# Patient Record
Sex: Female | Born: 1965 | Hispanic: No | Marital: Single | Smoking: Current every day smoker
Health system: Southern US, Community
[De-identification: ages and names within clinical notes are randomized; demographics above are authoritative.]

---

## 2017-08-13 ENCOUNTER — Other Ambulatory Visit: Payer: Self-pay

## 2017-08-13 ENCOUNTER — Emergency Department
Admission: EM | Admit: 2017-08-13 | Discharge: 2017-08-13 | Disposition: A | Discharge status: Home / Self Care | Payer: Self-pay | Attending: Emergency Medicine | Admitting: Emergency Medicine

## 2017-08-13 ENCOUNTER — Emergency Department: Payer: Self-pay

## 2017-08-13 DIAGNOSIS — F17200 Nicotine dependence, unspecified, uncomplicated: Secondary | ICD-10-CM | POA: Insufficient documentation

## 2017-08-13 DIAGNOSIS — Y929 Unspecified place or not applicable: Secondary | ICD-10-CM | POA: Insufficient documentation

## 2017-08-13 DIAGNOSIS — M25561 Pain in right knee: Secondary | ICD-10-CM

## 2017-08-13 DIAGNOSIS — M1711 Unilateral primary osteoarthritis, right knee: Secondary | ICD-10-CM | POA: Insufficient documentation

## 2017-08-13 DIAGNOSIS — X58XXXA Exposure to other specified factors, initial encounter: Secondary | ICD-10-CM | POA: Insufficient documentation

## 2017-08-13 DIAGNOSIS — Y939 Activity, unspecified: Secondary | ICD-10-CM | POA: Insufficient documentation

## 2017-08-13 DIAGNOSIS — Y999 Unspecified external cause status: Secondary | ICD-10-CM | POA: Insufficient documentation

## 2017-08-13 MED ORDER — MELOXICAM 15 MG PO TABS: 15.0000 mg | ORAL_TABLET | Freq: Every day | ORAL | 2 refills | Status: AC

## 2017-08-13 MED ORDER — IBUPROFEN 600 MG PO TABS
600.0000 mg | ORAL_TABLET | Freq: Once | ORAL | Status: AC
  Administered 2017-08-13: 600 mg via ORAL
  Filled 2017-08-13: qty 1

## 2017-08-13 NOTE — ED Triage Notes (Signed)
Right knee pain X 3 days. No known injury. States she feels like something is moving in her right knee cap. Swelling to knee and down to right ankle present. No obvious redness. Pain worse with ambulation

## 2017-08-13 NOTE — ED Notes (Signed)
Pt ambulatory to desk without difficulty. States that her knee is swollen. She is able to bear weight.

## 2017-08-13 NOTE — ED Provider Notes (Signed)
[facility] Emergency Department Provider Note  ____________________________________________   First MD Initiated Contact with Patient 08/13/17 [phone]     (approximate)  I have reviewed the triage vital signs and the nursing notes.   HISTORY  Chief Complaint Knee Pain  HPI Paula Fleming is a 52 y.o. female is here complaint of right knee pain for 3 days.  Patient states that she had an injury to her knee years ago where she was told she had a chip fracture.  She has been using topical creams to her knee and using ice and heat without any relief of her pain.  She has not taken any over-the-counter medication.  No recent injury.  Currently she rates her pain as 7 out of 10.  Patient continues to ambulate without assistance.  History reviewed. No pertinent past medical history.  There are no active problems to display for this patient.   History reviewed. No pertinent surgical history.  Prior to Admission medications   Medication Sig Start Date End Date Taking? Authorizing Provider  meloxicam (MOBIC) 15 MG tablet Take 1 tablet (15 mg total) by mouth daily. 08/13/17 08/13/18  Tommi RumpsSummers, Rhonda L, PA-C    Allergies Patient has no known allergies.  No family history on file.  Social History Social History   Tobacco Use  . Smoking status: Current Every Day Smoker  Substance Use Topics  . Alcohol use: Not Currently  . Drug use: Not on file    Review of Systems Constitutional: No fever/chills Cardiovascular: Denies chest pain. Respiratory: Denies shortness of breath. Gastrointestinal: No abdominal pain.  No nausea, no vomiting.  Genitourinary: Negative for dysuria. Musculoskeletal: Positive for right knee pain. Skin: Negative for rash. Neurological: Negative for headaches ___________________________________________   PHYSICAL EXAM:  VITAL SIGNS: ED Triage Vitals [08/13/17 0820]  Enc Vitals Group     BP (!) 147/99     Pulse Rate 79   Resp 18     Temp 98.2 F (36.8 C)     Temp Source Oral     SpO2 100 %     Weight 195 lb (88.5 kg)     Height 5\' 7"  (1.702 m)     Head Circumference      Peak Flow      Pain Score 7     Pain Loc      Pain Edu?      Excl. in GC?    Constitutional: Alert and oriented. Well appearing and in no acute distress. Eyes: Conjunctivae are normal.  Head: Atraumatic. Nose: No congestion/rhinnorhea. Neck: No stridor.   Cardiovascular: Normal rate, regular rhythm. Grossly normal heart sounds.  Good peripheral circulation. Respiratory: Normal respiratory effort.  No retractions. Lungs CTAB. Musculoskeletal: Examination of the right knee there is no gross deformity however there is some degenerative changes noted.  There is also crepitus with range of motion.  No point tenderness and no effusion is present.  Skin is intact.  No erythema or warmth is noted. Neurologic:  Normal speech and language. No gross focal neurologic deficits are appreciated. Skin:  Skin is warm, dry and intact. No rash noted. Psychiatric: Mood and affect are normal. Speech and behavior are normal.  ____________________________________________   LABS (all labs ordered are listed, but only abnormal results are displayed)  Labs Reviewed - No data to display ____________________________________________  RADIOLOGY  ED MD interpretation:   Right knee x-ray shows degenerative changes.  No fracture noted.  Official radiology report(s): Dg Knee Complete  4 Views Right  Result Date: 08/13/2017 CLINICAL DATA:  Acute right knee pain. EXAM: RIGHT KNEE - COMPLETE 4+ VIEW COMPARISON:  None. FINDINGS: No evidence of acute fracture, dislocation, or joint effusion. Mild narrowing of medial joint space is noted. 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 Soft tissues are unremarkable. IMPRESSION: Mild degenerative joint disease is noted medially. No  acute abnormality seen in the right knee. Electronically Signed   By: Lupita Raider, M.D.   On: 08/13/2017 09:33    ____________________________________________   PROCEDURES  Procedure(s) performed:   .Splint Application Date/Time: 08/13/2017 1:20 PM Performed by: Gabriela Eves, RN Authorized by: Tommi Rumps, PA-C   Consent:    Consent obtained:  Verbal   Consent given by:  Patient   Risks discussed:  Pain   Alternatives discussed:  Referral Pre-procedure details:    Sensation:  Normal Procedure details:    Laterality:  Right   Location:  Knee   Splint type:  Knee immobilizer   Supplies:  Prefabricated splint Post-procedure details:    Pain:  Unchanged   Sensation:  Normal   Patient tolerance of procedure:  Tolerated well, no immediate complications   Critical Care performed: No ____________________________________________   INITIAL IMPRESSION / ASSESSMENT AND PLAN / ED COURSE  As part of my medical decision making, I reviewed the following data within the electronic MEDICAL RECORD NUMBER Notes from prior ED visits and [place] Controlled Substance Database   Clinical Course as of Aug 13 1316  Wed Aug 13, 2017  0901 DG Knee Complete 4 Views Right [RS]    Clinical Course User Index [RS] Tommi Rumps, PA-C   Patient was reassured that her right knee pain was not due to fracture.  She was made aware that she has degenerative changes in her knee.  Patient was given a prescription for meloxicam 15 mg 1 daily and placed in a knee immobilizer.  She is to follow-up with her PCP or [facility] if any continued problems.  She was instructed to use ice and elevate if needed for knee pain or for swelling.  ____________________________________________   FINAL CLINICAL IMPRESSION(S) / ED DIAGNOSES  Final diagnoses:  Acute pain of right knee  Osteoarthritis of right knee, unspecified osteoarthritis type     ED Discharge Orders        Ordered    meloxicam  (MOBIC) 15 MG tablet  Daily     08/13/17 0945       Note:  This document was prepared using Dragon voice recognition software and may include unintentional dictation errors.    Tommi Rumps, PA-C 08/13/17 1321    Governor Rooks, MD 08/13/17 [phone]

## 2017-08-13 NOTE — Discharge Instructions (Addendum)
Begin taking meloxicam 15 mg once daily with food. [facility].  You may also follow-up with the doctor that is listed on your Medicaid card.

## 2019-01-23 IMAGING — DX DG KNEE COMPLETE 4+V*R*
4 series · 4 of 4 positions shown · non-contrast
Comparison: None.

CLINICAL DATA: Acute right knee pain.

EXAM:
RIGHT KNEE - COMPLETE 4+ VIEW

[knee ap]
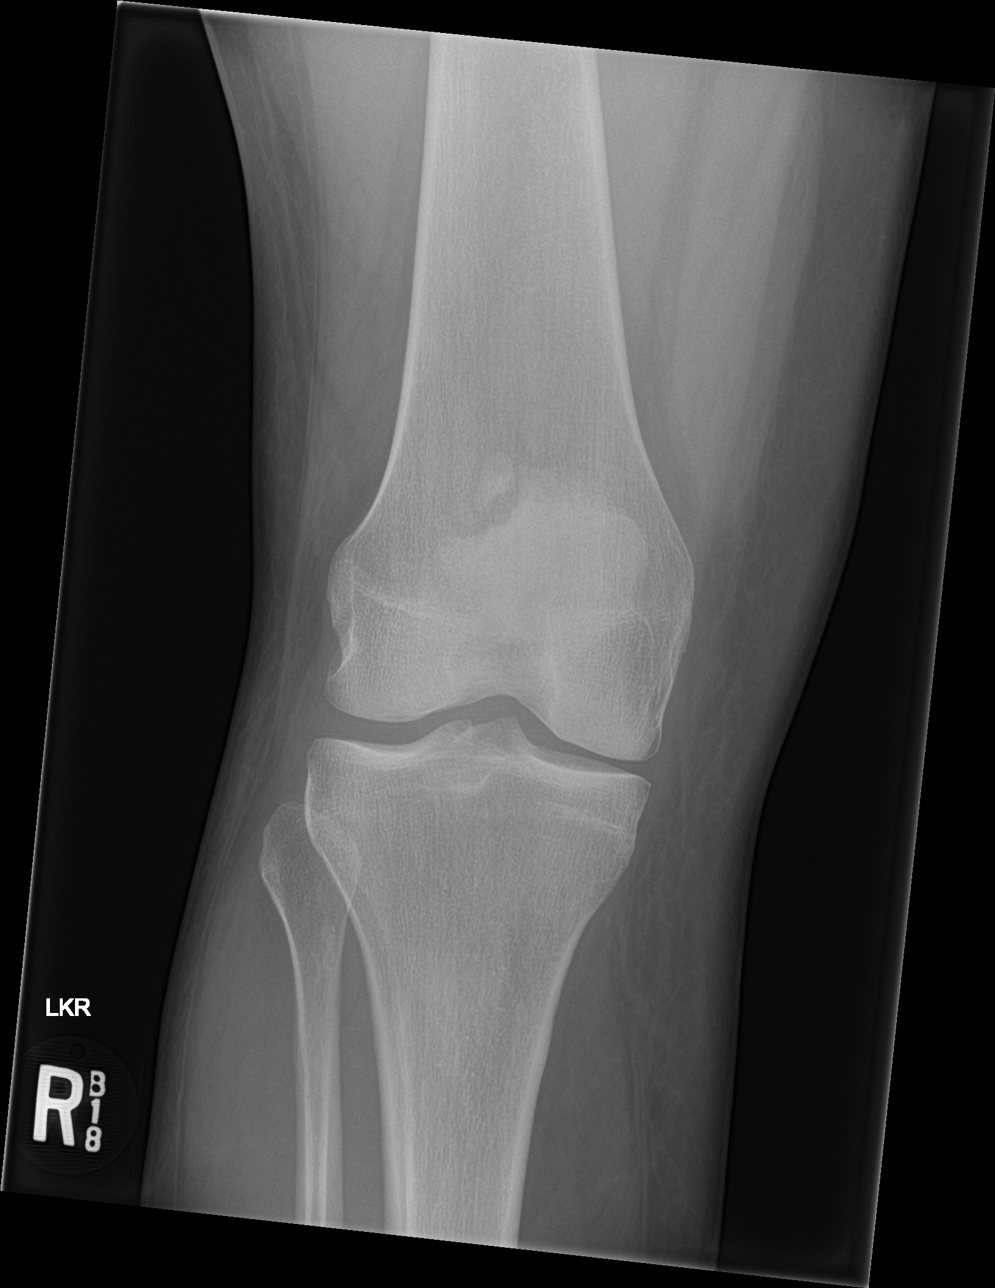

[knee lat]
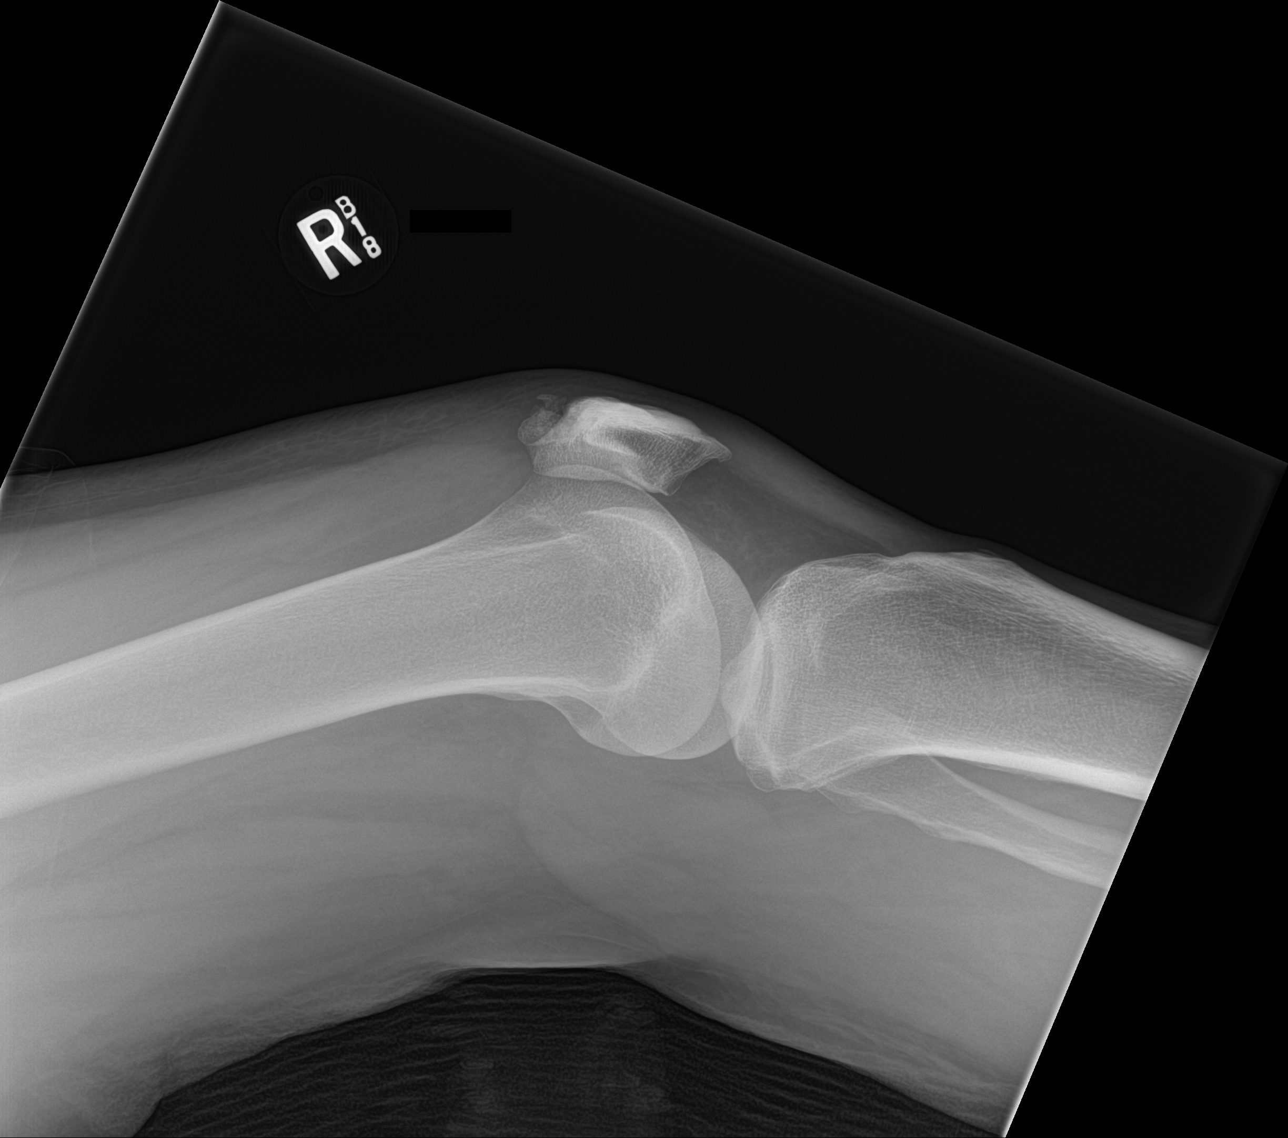

[knee obl (1 of 2)]
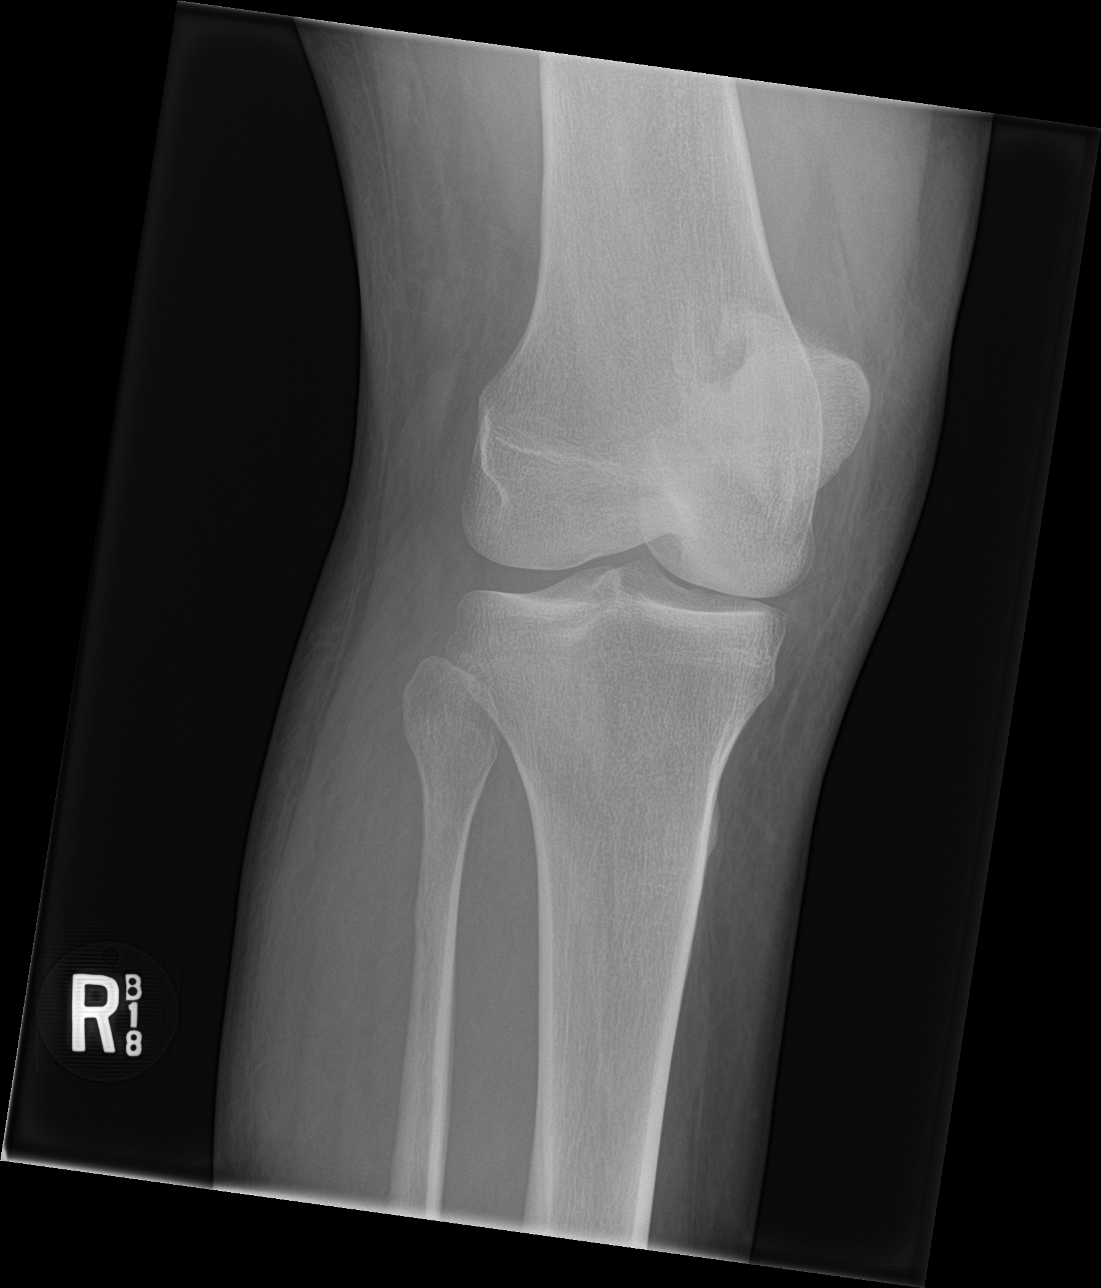

[knee obl (2 of 2)]
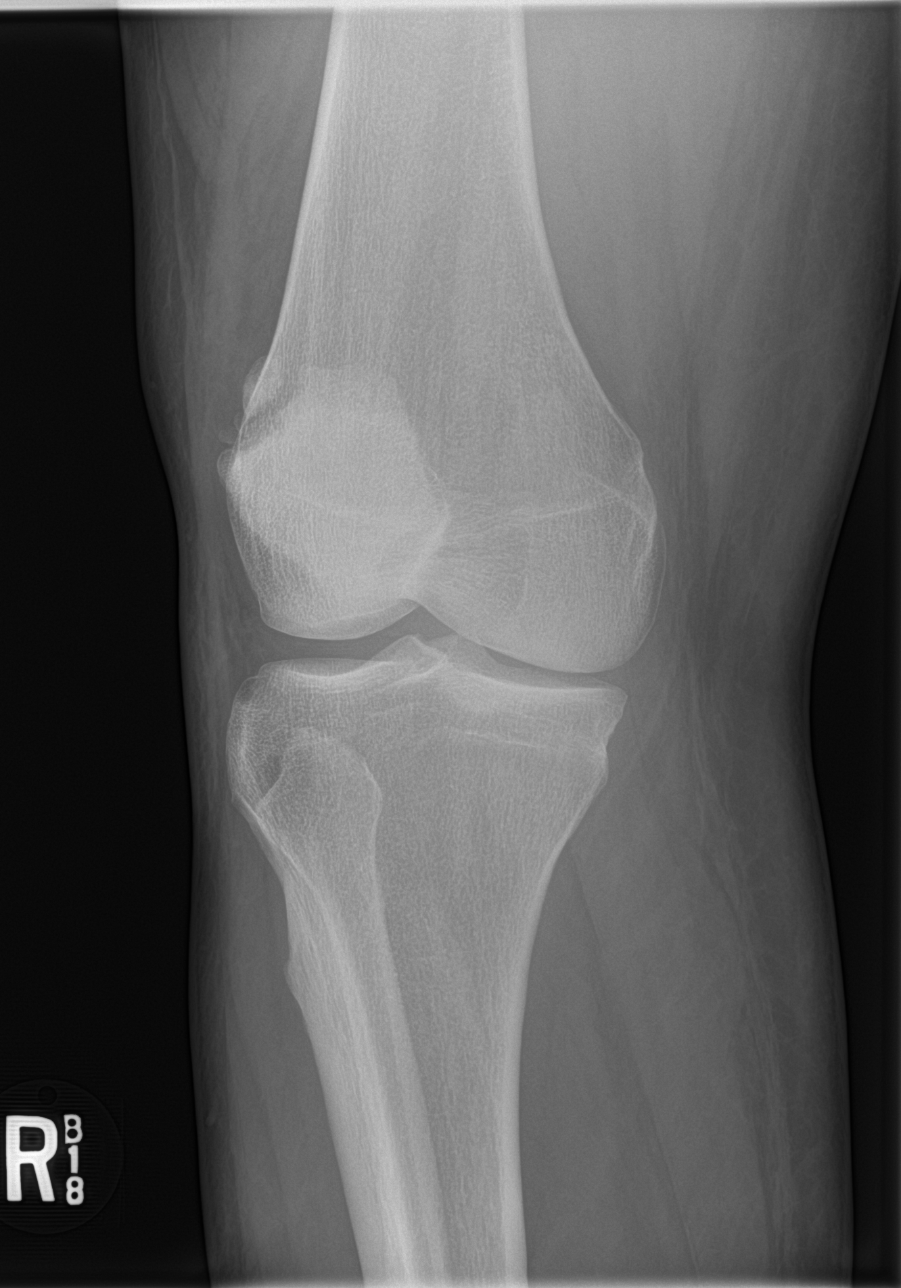

[4 of 4 positions shown; findings below may reference images not displayed]

FINDINGS: No evidence of acute fracture, dislocation, or joint effusion. Mild
narrowing of medial joint space is noted. Possible old fracture is
seen involving the superolateral portion of the patella, or may
represent congenital bipartite patella. Soft tissues are
unremarkable.
IMPRESSION: Mild degenerative joint disease is noted medially. No acute
abnormality seen in the right knee.

## 2020-03-24 ENCOUNTER — Other Ambulatory Visit: Payer: Self-pay

## 2020-03-28 ENCOUNTER — Ambulatory Visit (LOCAL_COMMUNITY_HEALTH_CENTER): Payer: Medicaid Other

## 2020-03-28 ENCOUNTER — Other Ambulatory Visit: Payer: Self-pay

## 2020-03-28 DIAGNOSIS — Z111 Encounter for screening for respiratory tuberculosis: Secondary | ICD-10-CM

## 2020-03-31 ENCOUNTER — Ambulatory Visit (LOCAL_COMMUNITY_HEALTH_CENTER): Payer: Medicaid Other

## 2020-03-31 ENCOUNTER — Other Ambulatory Visit: Payer: Self-pay

## 2020-03-31 DIAGNOSIS — Z111 Encounter for screening for respiratory tuberculosis: Secondary | ICD-10-CM

## 2020-03-31 LAB — TB SKIN TEST
Induration: 0 mm
TB Skin Test: NEGATIVE

## 2021-04-27 ENCOUNTER — Other Ambulatory Visit: Payer: Self-pay

## 2021-04-27 ENCOUNTER — Ambulatory Visit (LOCAL_COMMUNITY_HEALTH_CENTER): Payer: Self-pay

## 2021-04-27 DIAGNOSIS — Z111 Encounter for screening for respiratory tuberculosis: Secondary | ICD-10-CM

## 2021-04-30 ENCOUNTER — Other Ambulatory Visit: Payer: Self-pay

## 2021-04-30 ENCOUNTER — Ambulatory Visit (LOCAL_COMMUNITY_HEALTH_CENTER): Payer: Self-pay

## 2021-04-30 DIAGNOSIS — Z111 Encounter for screening for respiratory tuberculosis: Secondary | ICD-10-CM

## 2021-04-30 LAB — TB SKIN TEST
Induration: 0 mm
TB Skin Test: NEGATIVE

## 2022-12-23 ENCOUNTER — Other Ambulatory Visit: Payer: 59

## 2022-12-24 ENCOUNTER — Other Ambulatory Visit: Payer: 59
